# Patient Record
Sex: Female | Born: 1960 | Race: White | Hispanic: No | Marital: Married | Smoking: Never smoker
Health system: Southern US, Community
[De-identification: ages and names within clinical notes are randomized; demographics above are authoritative.]

## PROBLEM LIST (undated history)

## (undated) DIAGNOSIS — I493 Ventricular premature depolarization: Secondary | ICD-10-CM

## (undated) HISTORY — DX: Ventricular premature depolarization: I49.3

## (undated) HISTORY — PX: OTHER SURGICAL HISTORY: SHX169

---

## 1998-10-19 ENCOUNTER — Other Ambulatory Visit: Admission: RE | Admit: 1998-10-19 | Discharge: 1998-10-19 | Payer: Self-pay | Admitting: *Deleted

## 1999-12-13 ENCOUNTER — Other Ambulatory Visit: Admission: RE | Admit: 1999-12-13 | Discharge: 1999-12-13 | Payer: Self-pay | Admitting: *Deleted

## 2001-01-25 ENCOUNTER — Other Ambulatory Visit: Admission: RE | Admit: 2001-01-25 | Discharge: 2001-01-25 | Payer: Self-pay | Admitting: *Deleted

## 2002-03-24 ENCOUNTER — Other Ambulatory Visit: Admission: RE | Admit: 2002-03-24 | Discharge: 2002-03-24 | Payer: Self-pay | Admitting: *Deleted

## 2003-05-22 ENCOUNTER — Other Ambulatory Visit: Admission: RE | Admit: 2003-05-22 | Discharge: 2003-05-22 | Payer: Self-pay | Admitting: *Deleted

## 2004-08-09 ENCOUNTER — Other Ambulatory Visit: Admission: RE | Admit: 2004-08-09 | Discharge: 2004-08-09 | Payer: Self-pay | Admitting: Obstetrics and Gynecology

## 2005-05-23 ENCOUNTER — Ambulatory Visit (HOSPITAL_BASED_OUTPATIENT_CLINIC_OR_DEPARTMENT_OTHER): Admission: RE | Admit: 2005-05-23 | Discharge: 2005-05-23 | Payer: Self-pay | Admitting: Urology

## 2005-05-23 ENCOUNTER — Ambulatory Visit (HOSPITAL_COMMUNITY): Admission: RE | Admit: 2005-05-23 | Discharge: 2005-05-23 | Payer: Self-pay | Admitting: Urology

## 2005-10-08 ENCOUNTER — Encounter: Admission: RE | Admit: 2005-10-08 | Discharge: 2005-10-08 | Payer: Self-pay | Admitting: Obstetrics and Gynecology

## 2005-11-18 ENCOUNTER — Other Ambulatory Visit: Admission: RE | Admit: 2005-11-18 | Discharge: 2005-11-18 | Payer: Self-pay | Admitting: Obstetrics and Gynecology

## 2008-07-12 ENCOUNTER — Encounter: Admission: RE | Admit: 2008-07-12 | Discharge: 2008-07-12 | Payer: Self-pay | Admitting: Obstetrics and Gynecology

## 2009-05-22 ENCOUNTER — Encounter: Admission: RE | Admit: 2009-05-22 | Discharge: 2009-05-22 | Payer: Self-pay | Admitting: Obstetrics and Gynecology

## 2009-12-19 ENCOUNTER — Encounter: Admission: RE | Admit: 2009-12-19 | Discharge: 2009-12-19 | Payer: Self-pay | Admitting: Obstetrics and Gynecology

## 2010-07-10 ENCOUNTER — Encounter: Admission: RE | Admit: 2010-07-10 | Discharge: 2010-07-10 | Payer: Self-pay | Admitting: Obstetrics and Gynecology

## 2011-02-21 NOTE — Op Note (Signed)
NAMEJENEL, GIERKE                ACCOUNT NO.:  [id number]   MEDICAL RECORD NO.:  [id number]          PATIENT TYPE:  AMB   LOCATION:  [facility]                         FACILITY:  [facility]   PHYSICIAN:  Sigmund I. Patsi Sears, M.D.DATE OF BIRTH:  Feb 15, 1961   DATE OF PROCEDURE:  05/23/2005  DATE OF DISCHARGE:                                 OPERATIVE REPORT   PREOPERATIVE DIAGNOSES:  Urinary incontinence.   POSTOPERATIVE DIAGNOSES:  Urinary incontinence.   OPERATION:  [employer]  vaginal Obtryx sling.   SUBJECTIVE:  Sigmund I. Patsi Sears, M.D.   ASSISTANTMarland Kitchen  Kyung Bacca, NP-C.   PREPARATION:  After appropriate preanesthesia, the patient was brought to  the operating room, placed on the operating table in the dorsal supine  position where general LMA anesthesia was introduced. She was then replaced  in dorsal lithotomy position where the pubis was prepped with Betadine  solution, draped in the usual fashion.   DESCRIPTION OF PROCEDURE:  The Foley catheter was placed, posterior weighted  speculum was placed. 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 She had definite urethrocele.  10 mL of 0.25 plain Marcaine was injected in the periurethral space, and a  1.5 cm incision is made over the mid urethra. The subcutaneous tissue was  dissected, and the retropubic space is identified. The obturator fossa is  identified bilaterally. At a point 5 cm lateral to the clitoris, marks are  made, and stab wounds are made. The Obtryx  sling is then placed in standard  fashion and cystoscopy reveals no evidence of sling material within the  bladder. 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   The sling was tensioned in standard fashion, and the plastic sheathing  removed. Each wing was then cut subcutaneously, and these incisions were  closed with Dermabond. Following this, the wound itself was closed with a  running  2-0 Vicryl suture. Minimal bleeding was noted. The patient tolerated  the procedure well. A B&O suppository was inserted. She was given 30 mg of  IV Toradol, awakened and taken to the recovery room in good condition.      Sigmund I. Patsi Sears, M.D.  Electronically Signed     SIT/MEDQ  D:  05/23/2005  T:  05/23/2005  Job:  [id number]

## 2011-10-08 ENCOUNTER — Other Ambulatory Visit: Payer: Self-pay | Admitting: Obstetrics and Gynecology

## 2011-10-08 DIAGNOSIS — Z1231 Encounter for screening mammogram for malignant neoplasm of breast: Secondary | ICD-10-CM

## 2011-11-04 ENCOUNTER — Ambulatory Visit
Admission: RE | Admit: 2011-11-04 | Discharge: 2011-11-04 | Disposition: A | Discharge status: Home / Self Care | Payer: BC Managed Care – PPO | Source: Ambulatory Visit | Attending: Obstetrics and Gynecology | Admitting: Obstetrics and Gynecology

## 2011-11-04 DIAGNOSIS — Z1231 Encounter for screening mammogram for malignant neoplasm of breast: Secondary | ICD-10-CM

## 2011-11-06 ENCOUNTER — Other Ambulatory Visit: Payer: Self-pay | Admitting: Obstetrics and Gynecology

## 2011-11-06 DIAGNOSIS — R928 Other abnormal and inconclusive findings on diagnostic imaging of breast: Secondary | ICD-10-CM

## 2011-11-18 ENCOUNTER — Other Ambulatory Visit: Payer: BC Managed Care – PPO

## 2011-11-24 ENCOUNTER — Ambulatory Visit
Admission: RE | Admit: 2011-11-24 | Discharge: 2011-11-24 | Disposition: A | Discharge status: Home / Self Care | Payer: BC Managed Care – PPO | Source: Ambulatory Visit | Attending: Obstetrics and Gynecology | Admitting: Obstetrics and Gynecology

## 2011-11-24 DIAGNOSIS — R928 Other abnormal and inconclusive findings on diagnostic imaging of breast: Secondary | ICD-10-CM

## 2012-12-16 ENCOUNTER — Other Ambulatory Visit: Payer: Self-pay

## 2013-01-26 ENCOUNTER — Ambulatory Visit
Admission: RE | Admit: 2013-01-26 | Discharge: 2013-01-26 | Disposition: A | Discharge status: Home / Self Care | Payer: BC Managed Care – PPO | Source: Ambulatory Visit

## 2013-01-26 DIAGNOSIS — Z1231 Encounter for screening mammogram for malignant neoplasm of breast: Secondary | ICD-10-CM

## 2014-08-29 ENCOUNTER — Encounter (HOSPITAL_COMMUNITY): Payer: Self-pay

## 2014-09-06 ENCOUNTER — Ambulatory Visit (HOSPITAL_COMMUNITY): Payer: BC Managed Care – PPO | Admitting: Anesthesiology

## 2014-09-06 ENCOUNTER — Ambulatory Visit (HOSPITAL_COMMUNITY)
Admission: RE | Admit: 2014-09-06 | Discharge: 2014-09-06 | Disposition: A | Discharge status: Home / Self Care | Payer: BC Managed Care – PPO | Source: Ambulatory Visit | Attending: Obstetrics and Gynecology | Admitting: Obstetrics and Gynecology

## 2014-09-06 ENCOUNTER — Encounter (HOSPITAL_COMMUNITY)
Admission: RE | Disposition: A | Discharge status: Home / Self Care | Payer: Self-pay | Source: Ambulatory Visit | Attending: Obstetrics and Gynecology

## 2014-09-06 DIAGNOSIS — Z6839 Body mass index (BMI) 39.0-39.9, adult: Secondary | ICD-10-CM | POA: Diagnosis not present

## 2014-09-06 DIAGNOSIS — N840 Polyp of corpus uteri: Secondary | ICD-10-CM | POA: Diagnosis present

## 2014-09-06 DIAGNOSIS — N950 Postmenopausal bleeding: Secondary | ICD-10-CM | POA: Diagnosis present

## 2014-09-06 HISTORY — PX: HYSTEROSCOPY WITH D & C: SHX1775

## 2014-09-06 LAB — CBC
HCT: 36.9 % (ref 36.0–46.0)
HEMOGLOBIN: 12.3 g/dL (ref 12.0–15.0)
MCH: 29.3 pg (ref 26.0–34.0)
MCHC: 33.3 g/dL (ref 30.0–36.0)
MCV: 87.9 fL (ref 78.0–100.0)
PLATELETS: 288 10*3/uL (ref 150–400)
RBC: 4.2 MIL/uL (ref 3.87–5.11)
RDW: 13.9 % (ref 11.5–15.5)
WBC: 7.1 10*3/uL (ref 4.0–10.5)

## 2014-09-06 SURGERY — DILATATION AND CURETTAGE /HYSTEROSCOPY
Anesthesia: General | Site: Uterus

## 2014-09-06 MED ORDER — LIDOCAINE HCL 1 % IJ SOLN
INTRAMUSCULAR | Status: AC
  Filled 2014-09-06: qty 20

## 2014-09-06 MED ORDER — LIDOCAINE HCL (CARDIAC) 20 MG/ML IV SOLN
INTRAVENOUS | Status: AC
  Filled 2014-09-06: qty 5

## 2014-09-06 MED ORDER — KETOROLAC TROMETHAMINE 30 MG/ML IJ SOLN
INTRAMUSCULAR | Status: AC
  Filled 2014-09-06: qty 1

## 2014-09-06 MED ORDER — ONDANSETRON HCL 4 MG/2ML IJ SOLN
INTRAMUSCULAR | Status: AC
  Filled 2014-09-06: qty 2

## 2014-09-06 MED ORDER — LACTATED RINGERS IV SOLN
INTRAVENOUS | Status: DC
  Administered 2014-09-06: 11:00:00 via INTRAVENOUS

## 2014-09-06 MED ORDER — CEFAZOLIN SODIUM-DEXTROSE 2-3 GM-% IV SOLR
2.0000 g | INTRAVENOUS | Status: AC
  Administered 2014-09-06: 2 g via INTRAVENOUS

## 2014-09-06 MED ORDER — FENTANYL CITRATE 0.05 MG/ML IJ SOLN
INTRAMUSCULAR | Status: AC
  Filled 2014-09-06: qty 5

## 2014-09-06 MED ORDER — DEXAMETHASONE SODIUM PHOSPHATE 4 MG/ML IJ SOLN
INTRAMUSCULAR | Status: AC
  Filled 2014-09-06: qty 1

## 2014-09-06 MED ORDER — SCOPOLAMINE 1 MG/3DAYS TD PT72
MEDICATED_PATCH | TRANSDERMAL | Status: AC
  Administered 2014-09-06: 1.5 mg via TRANSDERMAL
  Filled 2014-09-06: qty 1

## 2014-09-06 MED ORDER — MIDAZOLAM HCL 2 MG/2ML IJ SOLN
INTRAMUSCULAR | Status: AC
  Filled 2014-09-06: qty 2

## 2014-09-06 MED ORDER — SCOPOLAMINE 1 MG/3DAYS TD PT72
1.0000 | MEDICATED_PATCH | Freq: Once | TRANSDERMAL | Status: DC
  Administered 2014-09-06: 1.5 mg via TRANSDERMAL

## 2014-09-06 MED ORDER — CEFAZOLIN SODIUM-DEXTROSE 2-3 GM-% IV SOLR
INTRAVENOUS | Status: AC
  Filled 2014-09-06: qty 50

## 2014-09-06 MED ORDER — FENTANYL CITRATE 0.05 MG/ML IJ SOLN: 25.0000 ug | INTRAMUSCULAR | Status: DC | PRN

## 2014-09-06 MED ORDER — PROPOFOL 10 MG/ML IV EMUL
INTRAVENOUS | Status: AC
  Filled 2014-09-06: qty 20

## 2014-09-06 MED ORDER — PROPOFOL 10 MG/ML IV BOLUS
INTRAVENOUS | Status: DC | PRN
  Administered 2014-09-06: 50 mg via INTRAVENOUS
  Administered 2014-09-06: 250 mg via INTRAVENOUS

## 2014-09-06 MED ORDER — LIDOCAINE HCL (CARDIAC) 20 MG/ML IV SOLN
INTRAVENOUS | Status: DC | PRN
  Administered 2014-09-06: 75 mg via INTRAVENOUS

## 2014-09-06 MED ORDER — ONDANSETRON HCL 4 MG/2ML IJ SOLN
INTRAMUSCULAR | Status: DC | PRN
  Administered 2014-09-06: 4 mg via INTRAVENOUS

## 2014-09-06 MED ORDER — SODIUM CHLORIDE 0.9 % IR SOLN
Status: DC | PRN
  Administered 2014-09-06: 3000 mL

## 2014-09-06 MED ORDER — FENTANYL CITRATE 0.05 MG/ML IJ SOLN
INTRAMUSCULAR | Status: DC | PRN
  Administered 2014-09-06 (×3): 50 ug via INTRAVENOUS

## 2014-09-06 MED ORDER — KETOROLAC TROMETHAMINE 30 MG/ML IJ SOLN
INTRAMUSCULAR | Status: DC | PRN
  Administered 2014-09-06: 30 mg via INTRAVENOUS

## 2014-09-06 MED ORDER — DEXAMETHASONE SODIUM PHOSPHATE 4 MG/ML IJ SOLN
INTRAMUSCULAR | Status: DC | PRN
  Administered 2014-09-06: 4 mg via INTRAVENOUS

## 2014-09-06 MED ORDER — LACTATED RINGERS IV SOLN: INTRAVENOUS | Status: DC

## 2014-09-06 MED ORDER — MIDAZOLAM HCL 2 MG/2ML IJ SOLN
INTRAMUSCULAR | Status: DC | PRN
  Administered 2014-09-06: 2 mg via INTRAVENOUS

## 2014-09-06 SURGICAL SUPPLY — 18 items
CANISTER SUCT 3000ML (MISCELLANEOUS) ×3 IMPLANT
CATH ROBINSON RED A/P 16FR (CATHETERS) ×3 IMPLANT
CLOTH BEACON ORANGE TIMEOUT ST (SAFETY) ×3 IMPLANT
CONTAINER PREFILL 10% NBF 60ML (FORM) ×6 IMPLANT
ELECT REM PT RETURN 9FT ADLT (ELECTROSURGICAL)
ELECTRODE REM PT RTRN 9FT ADLT (ELECTROSURGICAL) IMPLANT
GLOVE BIO SURGEON STRL SZ 6.5 (GLOVE) ×2 IMPLANT
GLOVE BIO SURGEONS STRL SZ 6.5 (GLOVE) ×1
GLOVE BIOGEL PI IND STRL 7.0 (GLOVE) IMPLANT
GLOVE BIOGEL PI INDICATOR 7.0 (GLOVE) ×4
GOWN STRL REUS W/TWL LRG LVL3 (GOWN DISPOSABLE) ×6 IMPLANT
LOOP ANGLED CUTTING 22FR (CUTTING LOOP) IMPLANT
PACK VAGINAL MINOR WOMEN LF (CUSTOM PROCEDURE TRAY) ×3 IMPLANT
PAD OB MATERNITY 4.3X12.25 (PERSONAL CARE ITEMS) ×3 IMPLANT
TOWEL OR 17X24 6PK STRL BLUE (TOWEL DISPOSABLE) ×6 IMPLANT
TUBING AQUILEX INFLOW (TUBING) ×3 IMPLANT
TUBING AQUILEX OUTFLOW (TUBING) ×3 IMPLANT
WATER STERILE IRR 1000ML POUR (IV SOLUTION) ×3 IMPLANT

## 2014-09-06 NOTE — Op Note (Signed)
NAMMilbert Coulter:  Dauphine, Gayla                ACCOUNT NO.:  [id number]  MEDICAL RECORD NO.:  [id number]  LOCATION:  [facility]                          FACILITY:  [facility]  PHYSICIAN:  Takelia Urieta L. Bienvenido Proehl, M.D.DATE OF BIRTH:  06-Jul-1961  DATE OF PROCEDURE:  09/06/2014 DATE OF DISCHARGE:  09/06/2014                              OPERATIVE REPORT   PREOPERATIVE DIAGNOSIS:  Postmenopausal bleeding and endometrial polyps.  POSTOPERATIVE DIAGNOSIS:  Postmenopausal bleeding and endometrial polyps.  PROCEDURE:  Dilation and curettage, hysteroscopy, and resection of endometrial polyps.  SURGEON:  Jaedon Siler L. Kailyn Dubie, MD  ANESTHESIA:  MAC paracervical.  EBL:  Minimal.  COMPLICATIONS:  None.  DESCRIPTION OF PROCEDURE:  The patient was taken to the operating room. She was then administered anesthesia.  She was prepped and draped in usual sterile fashion.  The cervix was grasped with a tenaculum.  The cervical internal os was gently dilated using Pratt dilators.  The diagnostic hysteroscope was inserted into the uterine cavity with excellent visualization.  I could see she had multiple polypoid areas noted in the uterus.  The hysteroscope was removed.  A sharp curette was inserted, and the uterus was thoroughly curetted of all tissue, and polyp forceps were inserted and polypoid tissue was removed.  The hysteroscope was reinserted, and the uterine cavity appeared to be clean.  A sharp curettage was then obtained.  All tissue was sent to pathology.  She had no bleeding at the end of the procedure.  The fluid deficit was 115 mL. The patient tolerated the procedure well and went to recovery room in stable condition.     Xareni Kelch L. Vincente PoliGrewal, M.D.     Florestine AversMLG/MEDQ  D:  09/06/2014  T:  09/06/2014  Job:  [id number]

## 2014-09-06 NOTE — H&P (Signed)
Wonda OldsLisa M Batt is an 53 year old female with endometrial polyps and heavy bleeding  Pertinent Gynecological History: Menses: post-menopausal Bleeding: post menopausal bleeding Contraception: post menopausal status DES exposure: denies Blood transfusions: none Sexually transmitted diseases: no past history Previous GYN Procedures: none  Last mammogram: normal Date: 2015 Last pap: normal Date: 2015 OB History: G1, P1  Menstrual History: Menarche age: unknown Patient's last menstrual period was 08/26/2014.    History reviewed. No pertinent past medical history.  Past Surgical History  Procedure Laterality Date  . Cesarean section    . Transvaginal mesh      History reviewed. No pertinent family history.  Social History:  reports that she has never smoked. She has never used smokeless tobacco. She reports that she does not drink alcohol or use illicit drugs.  Allergies: No Known Allergies  No prescriptions prior to admission    Review of Systems  All other systems reviewed and are negative.   Height 5\' 7"  (1.702 m), weight 113.853 kg (251 lb), last menstrual period 08/26/2014. Physical Exam  Nursing note and vitals reviewed. Constitutional: She appears well-developed and well-nourished.  HENT:  Head: Normocephalic and atraumatic.  Eyes: Pupils are equal, round, and reactive to light.  Neck: Normal range of motion.  Cardiovascular: Normal rate and regular rhythm.   Respiratory: Effort normal.  GI: Soft.    No results found for this or any previous visit (from the past 24 hour(s)).  No results found.  Assessment/Plan: Postmenopausal bleeding Endometrial polyps Recommend D and C, Hysteroscopy and removal of endometrial polyps Risks reviewed  Consent signed Obie Kallenbach L 09/06/2014, 8:59 AM

## 2014-09-06 NOTE — Anesthesia Postprocedure Evaluation (Signed)
  Anesthesia Post-op Note  Patient: Sabrina Brown  Procedure(s) Performed: Procedure(s): DILATATION AND CURETTAGE /HYSTEROSCOPY (N/A) Patient is awake and responsive. Pain and nausea are reasonably well controlled. Vital signs are stable and clinically acceptable. Oxygen saturation is clinically acceptable. There are no apparent anesthetic complications at this time. Patient is ready for discharge.

## 2014-09-06 NOTE — Transfer of Care (Signed)
Immediate Anesthesia Transfer of Care Note  Patient: Sabrina OldsLisa M Brown  Procedure(s) Performed: Procedure(s): DILATATION AND CURETTAGE /HYSTEROSCOPY (N/A)  Patient Location: PACU  Anesthesia Type:General  Level of Consciousness: awake, alert  and oriented  Airway & Oxygen Therapy: Patient Spontanous Breathing and Patient connected to nasal cannula oxygen  Post-op Assessment: Report given to PACU RN and Post -op Vital signs reviewed and stable  Post vital signs: Reviewed and stable  Complications: No apparent anesthesia complications

## 2014-09-06 NOTE — Brief Op Note (Signed)
09/06/2014  12:41 PM  PATIENT:  Sabrina Brown  53 y.o. female  PRE-OPERATIVE DIAGNOSIS:  POLYPS  POST-OPERATIVE DIAGNOSIS:  POLYPS  PROCEDURE:  Procedure(s): DILATATION AND CURETTAGE /HYSTEROSCOPY (N/A)  SURGEON:  Surgeon(s) and Role:    * Jeani HawkingMichelle L Makeisha Jentsch, MD - Primary  PHYSICIAN ASSISTANT:   ASSISTANTS: none   ANESTHESIA:   IV sedation, paracervical block and MAC  EBL:     BLOOD ADMINISTERED:none  DRAINS: none   LOCAL MEDICATIONS USED:  NONE  SPECIMEN:  Source of Specimen:  uterine currettings  DISPOSITION OF SPECIMEN:  PATHOLOGY  COUNTS:  YES  TOURNIQUET:  * No tourniquets in log *  DICTATION: .Note written in EPIC and Other Dictation: Dictation Number [id number]  PLAN OF CARE: Discharge to home after PACU  PATIENT DISPOSITION:  PACU - hemodynamically stable.   Delay start of Pharmacological VTE agent (>24hrs) due to surgical blood loss or risk of bleeding: not applicable

## 2014-09-06 NOTE — Anesthesia Preprocedure Evaluation (Signed)
Anesthesia Evaluation  Patient identified by MRN, date of birth, ID band Patient awake    Reviewed: Allergy & Precautions, H&P , Patient's Chart, lab work & pertinent test results, reviewed documented beta blocker date and time   Airway Mallampati: II  TM Distance: >3 FB Neck ROM: full    Dental no notable dental hx.    Pulmonary  breath sounds clear to auscultation  Pulmonary exam normal       Cardiovascular Rhythm:regular Rate:Normal     Neuro/Psych    GI/Hepatic   Endo/Other  Morbid obesity  Renal/GU      Musculoskeletal   Abdominal   Peds  Hematology   Anesthesia Other Findings   Reproductive/Obstetrics                             Anesthesia Physical Anesthesia Plan  ASA: III  Anesthesia Plan:    Post-op Pain Management:    Induction: Intravenous  Airway Management Planned: LMA  Additional Equipment:   Intra-op Plan:   Post-operative Plan:   Informed Consent: I have reviewed the patients History and Physical, chart, labs and discussed the procedure including the risks, benefits and alternatives for the proposed anesthesia with the patient or authorized representative who has indicated his/her understanding and acceptance.   Dental Advisory Given and Dental advisory given  Plan Discussed with: CRNA and Surgeon  Anesthesia Plan Comments: (Discussed GA with LMA, possible sore throat, potential need to switch to ETT, N/V, pulmonary aspiration. Questions answered. )        Anesthesia Quick Evaluation  

## 2014-09-06 NOTE — Discharge Instructions (Signed)
DISCHARGE INSTRUCTIONS: HYSTEROSCOPY / ENDOMETRIAL ABLATION The following instructions have been prepared to help you care for yourself upon your return home.  Scop patch may be remove on or before 09/08/2014.  May take Ibuprofen after 6:45 p.m. as needed for cramps  Personal hygiene:  Use sanitary pads for vaginal drainage, not tampons.  Shower the day after your procedure.  NO tub baths, pools or Jacuzzis for 2-3 weeks.  Wipe front to back after using the bathroom.  Activity and limitations:  Do NOT drive or operate any equipment for 24 hours. The effects of anesthesia are still present and drowsiness may result.  Do NOT rest in bed all day.  Walking is encouraged.  Walk up and down stairs slowly.  You may resume your normal activity in one to two days or as indicated by your physician.  Sexual activity: NO intercourse for at least 2 weeks after the procedure, or as indicated by your Doctor.  Diet: Eat a light meal as desired this evening. You may resume your usual diet tomorrow.  Return to Work: You may resume your work activities in one to two days or as indicated by Therapist, sportsyour Doctor.  What to expect after your surgery: Expect to have vaginal bleeding/discharge for 2-3 days and spotting for up to 10 days. It is not unusual to have soreness for up to 1-2 weeks. You may have a slight burning sensation when you urinate for the first day. Mild cramps may continue for a couple of days. You may have a regular period in 2-6 weeks.  Call your doctor for any of the following:  Excessive vaginal bleeding or clotting, saturating and changing one pad every hour.  Inability to urinate 6 hours after discharge from hospital.  Pain not relieved by pain medication.  Fever of 100.4 F or greater.  Unusual vaginal discharge or odor.  Post Anesthesia Care Unit [phone]

## 2014-09-06 NOTE — Progress Notes (Signed)
Megace stopped bleeding Will proceed with D and C, Hysteroscopy and polyp removal Consent signed H and P on the chart

## 2014-09-07 ENCOUNTER — Encounter (HOSPITAL_COMMUNITY): Payer: Self-pay | Admitting: Obstetrics and Gynecology

## 2014-09-19 ENCOUNTER — Ambulatory Visit (HOSPITAL_BASED_OUTPATIENT_CLINIC_OR_DEPARTMENT_OTHER)
Admission: RE | Admit: 2014-09-19 | Payer: BC Managed Care – PPO | Source: Ambulatory Visit | Admitting: Obstetrics and Gynecology

## 2014-09-19 ENCOUNTER — Encounter (HOSPITAL_BASED_OUTPATIENT_CLINIC_OR_DEPARTMENT_OTHER): Admission: RE | Payer: Self-pay | Source: Ambulatory Visit

## 2014-09-19 SURGERY — DILATATION AND CURETTAGE /HYSTEROSCOPY
Anesthesia: Choice

## 2015-05-30 ENCOUNTER — Other Ambulatory Visit: Payer: Self-pay | Admitting: Obstetrics and Gynecology

## 2015-05-31 LAB — CYTOLOGY - PAP

## 2016-07-23 ENCOUNTER — Other Ambulatory Visit: Payer: Self-pay | Admitting: Obstetrics and Gynecology

## 2016-07-23 DIAGNOSIS — Z1231 Encounter for screening mammogram for malignant neoplasm of breast: Secondary | ICD-10-CM

## 2016-08-12 ENCOUNTER — Ambulatory Visit
Admission: RE | Admit: 2016-08-12 | Discharge: 2016-08-12 | Disposition: A | Discharge status: Home / Self Care | Payer: BC Managed Care – PPO | Source: Ambulatory Visit | Attending: Obstetrics and Gynecology | Admitting: Obstetrics and Gynecology

## 2016-08-12 DIAGNOSIS — Z1231 Encounter for screening mammogram for malignant neoplasm of breast: Secondary | ICD-10-CM

## 2020-04-11 ENCOUNTER — Other Ambulatory Visit: Payer: Self-pay | Admitting: Obstetrics and Gynecology

## 2020-04-11 DIAGNOSIS — Z1231 Encounter for screening mammogram for malignant neoplasm of breast: Secondary | ICD-10-CM

## 2020-06-18 ENCOUNTER — Other Ambulatory Visit: Payer: Self-pay

## 2020-06-18 ENCOUNTER — Ambulatory Visit
Admission: RE | Admit: 2020-06-18 | Discharge: 2020-06-18 | Disposition: A | Discharge status: Home / Self Care | Payer: BC Managed Care – PPO | Source: Ambulatory Visit | Attending: Obstetrics and Gynecology | Admitting: Obstetrics and Gynecology

## 2020-06-18 DIAGNOSIS — Z1231 Encounter for screening mammogram for malignant neoplasm of breast: Secondary | ICD-10-CM

## 2021-12-21 IMAGING — MG DIGITAL SCREENING BILAT W/ TOMO W/ CAD
8 series · 8 of 24 positions shown · non-contrast
Comparison: Previous exam(s).

ACR Breast Density Category a: The breast tissue is almost entirely
fatty.

CLINICAL DATA: Screening.

EXAM:
DIGITAL SCREENING BILATERAL MAMMOGRAM WITH TOMO AND CAD

[L MLO synth-2D]
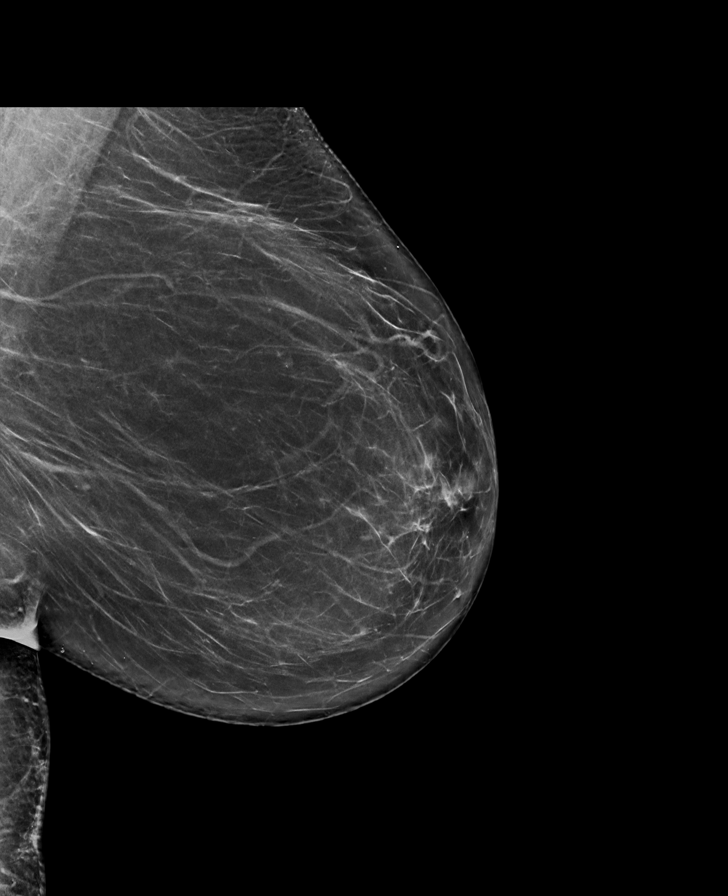

[R CC synth-2D]
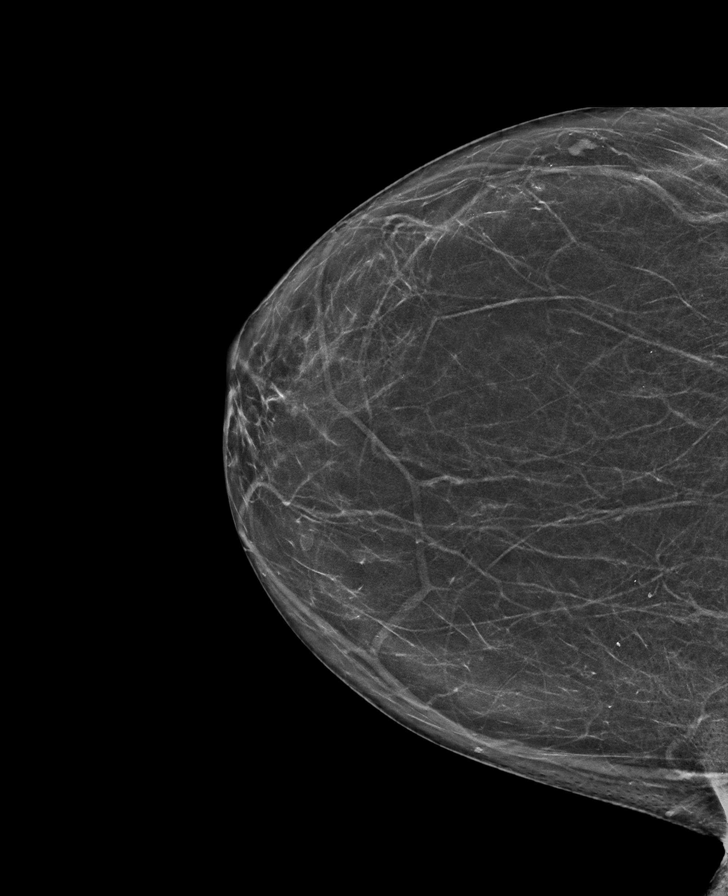

[R MLO synth-2D]
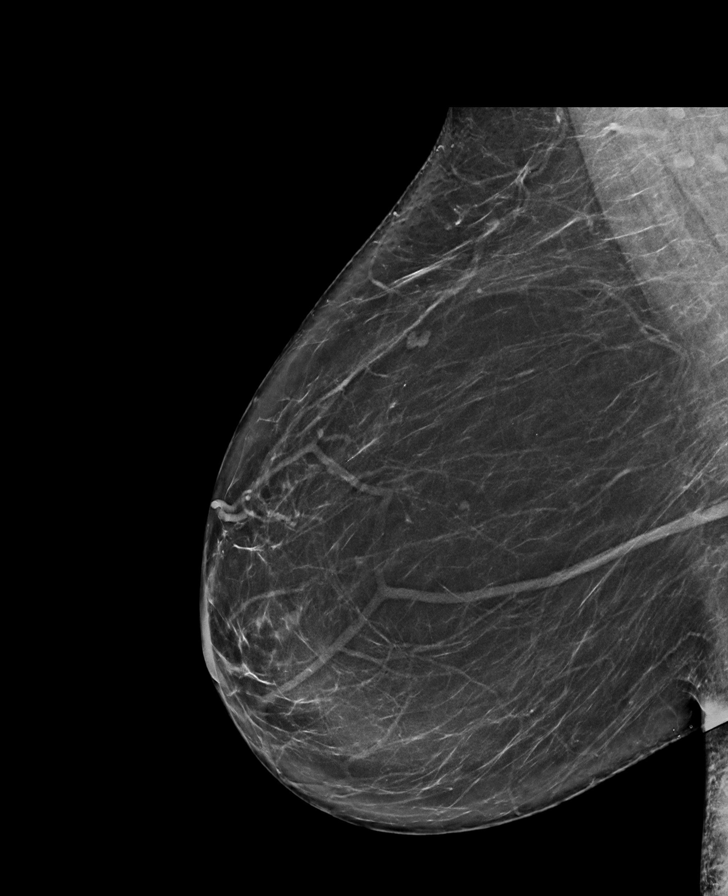

[L CC synth-2D]
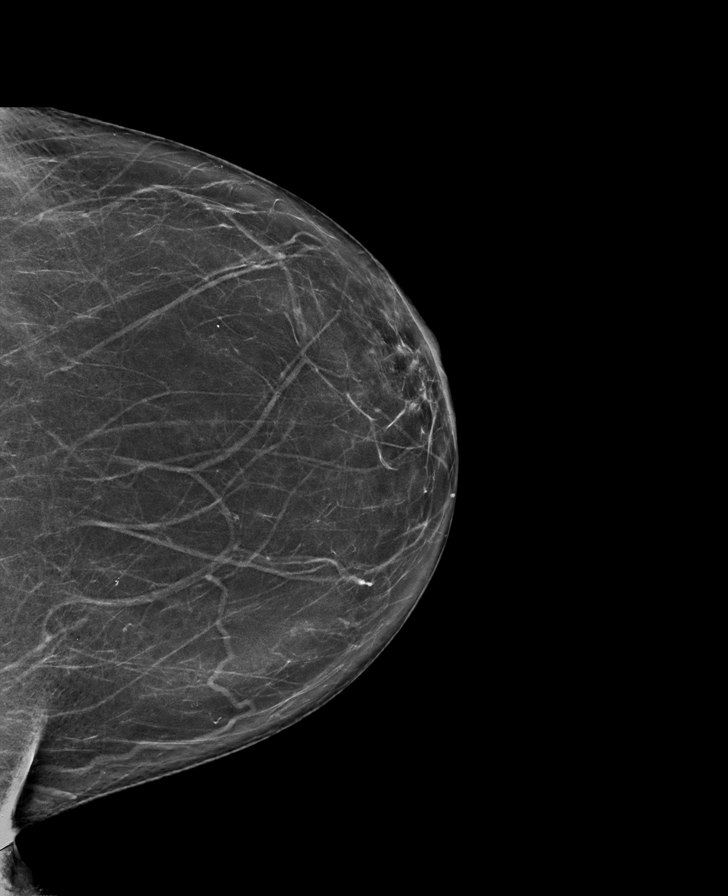

[L CC tomo · tomo slice 37/72.0]
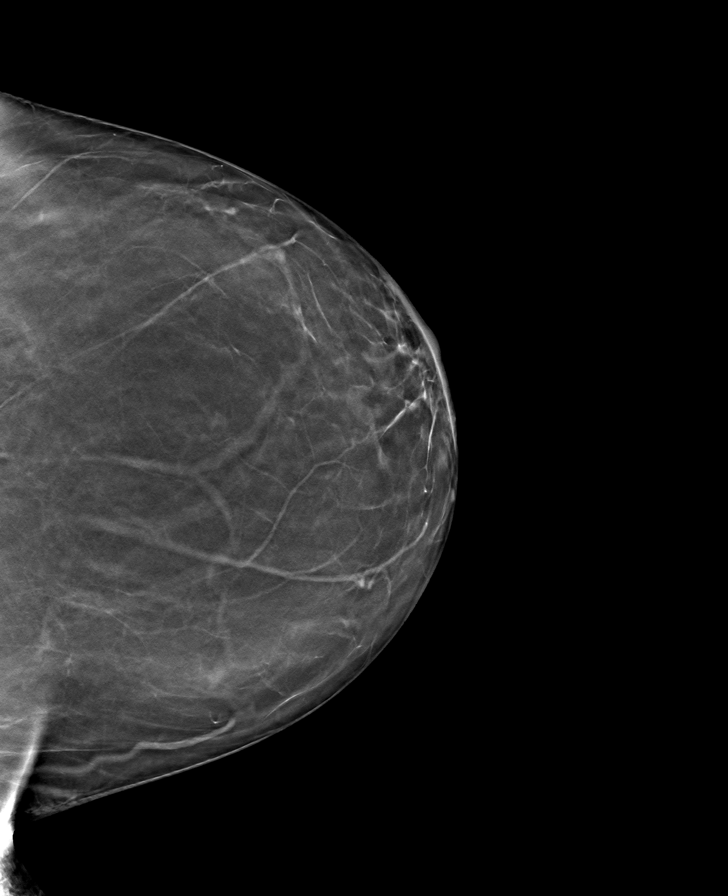

[R CC tomo · tomo slice 34/67.0]
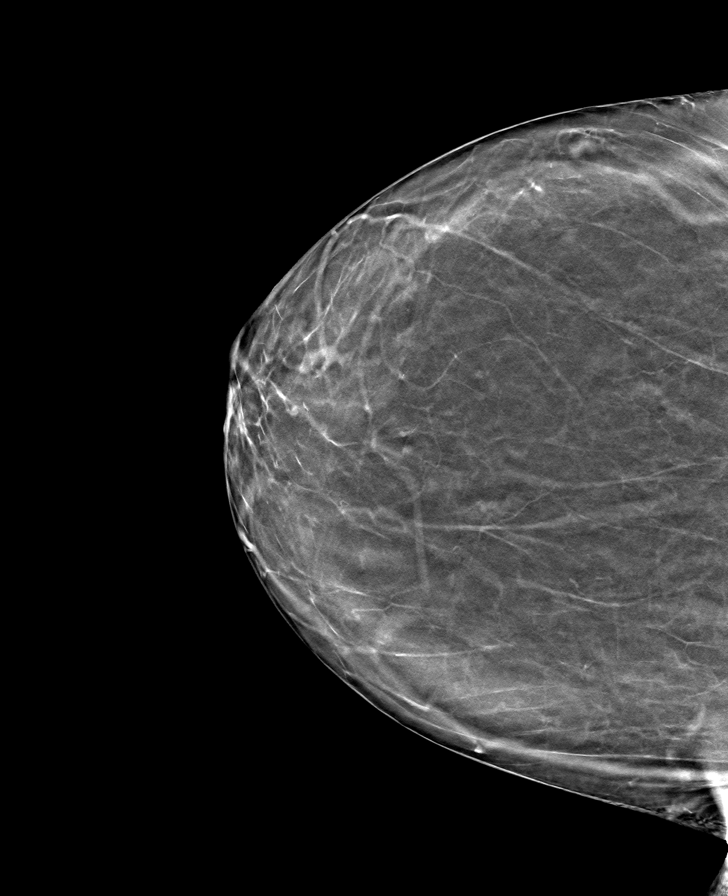

[L MLO tomo · tomo slice 41/81.0]
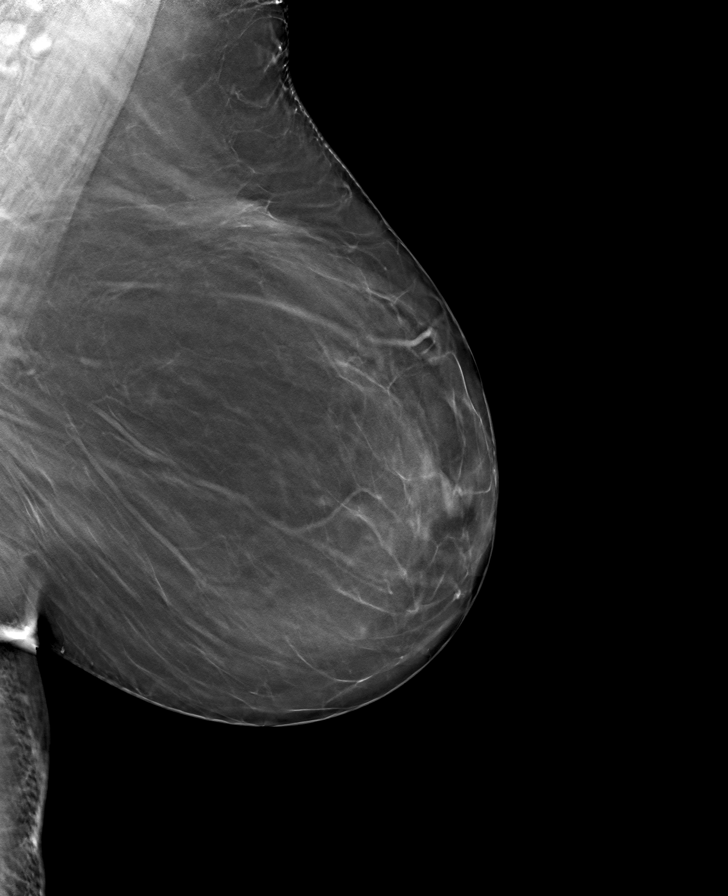

[R MLO tomo · tomo slice 40/79.0]
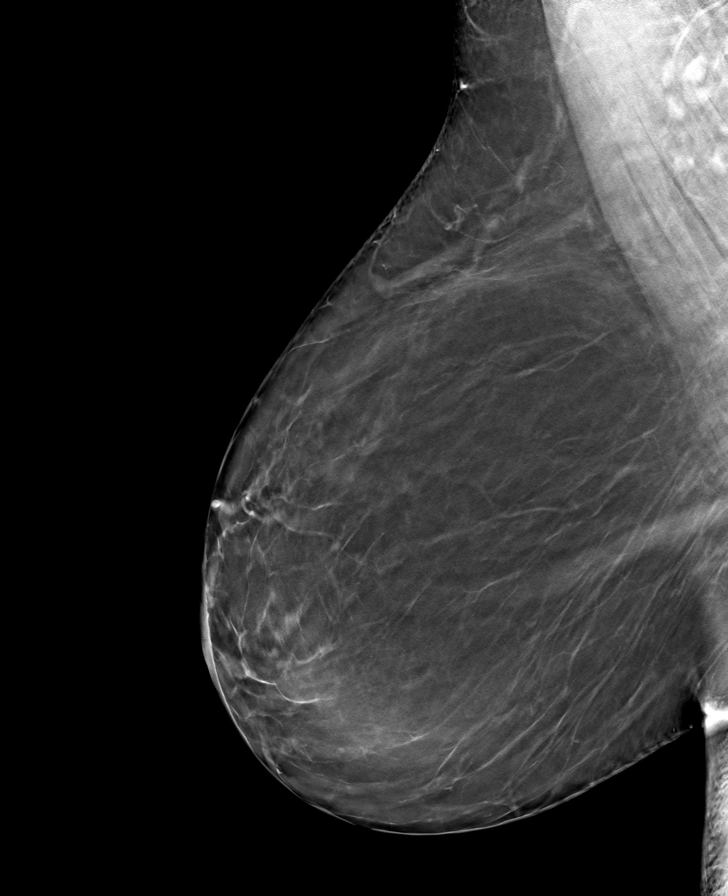

[8 of 24 positions shown; findings below may reference images not displayed]

FINDINGS: There are no findings suspicious for malignancy. Images were
processed with CAD.
IMPRESSION: No mammographic evidence of malignancy. A result letter of this
screening mammogram will be mailed directly to the patient.

RECOMMENDATION:
Screening mammogram in one year. (Code:8Y-Q-VVS)

BI-RADS CATEGORY  1: Negative.

## 2023-09-03 LAB — EXTERNAL GENERIC LAB PROCEDURE: COLOGUARD: POSITIVE — AB

## 2023-09-03 LAB — COLOGUARD: COLOGUARD: POSITIVE — AB

## 2023-09-23 ENCOUNTER — Encounter: Payer: Self-pay | Admitting: Internal Medicine

## 2023-10-27 ENCOUNTER — Telehealth: Payer: Self-pay | Admitting: *Deleted

## 2023-10-27 ENCOUNTER — Ambulatory Visit (AMBULATORY_SURGERY_CENTER): Payer: 59 | Admitting: *Deleted

## 2023-10-27 VITALS — Ht 66.0 in | Wt 263.0 lb

## 2023-10-27 DIAGNOSIS — Z1211 Encounter for screening for malignant neoplasm of colon: Secondary | ICD-10-CM

## 2023-10-27 MED ORDER — SUFLAVE 178.7 G PO SOLR: 1.0000 | ORAL | 0 refills | Status: AC

## 2023-10-27 NOTE — Progress Notes (Signed)
Pt's name and DOB verified at the beginning of the pre-visit wit 2 identifiers  Pt denies any difficulty with ambulating,sitting, laying down or rolling side to side  Pt has no issues with ambulation   Pt has no issues moving head neck or swallowing  No egg or soy allergy known to patient   No issues known to pt with past sedation with any surgeries or procedures  Pt denies having issues being intubated  No FH of Malignant Hyperthermia  Pt is not on diet pills or shots  Pt is not on home 02   Pt is not on blood thinners   Pt denies issues with constipation   Pt is not on dialysis  Pt hx of benign PVC's  Pt denies any upcoming cardiac testing  Pt encouraged to use to use Singlecare or Goodrx to reduce cost   Patient's chart reviewed by Cathlyn Parsons CNRA prior to pre-visit and patient appropriate for the [facility].  Pre-visit completed and red dot placed by patient's name on their procedure day (on provider's schedule).  .  Visit by phone  Pt states weight is 263 lb  Instructed pt why it is important to and  to call if they have any changes in health or new medications. Directed them to the # given and on instructions.     Instructions reviewed. Pt given both [facility] main # and MD on call # prior to instructions.  Pt states understanding. Instructed to review again prior to procedure. Pt states they will.   Informed pt that they will receive a call from [facility] regarding there prep med.

## 2023-10-27 NOTE — Telephone Encounter (Signed)
Attempt to reach pt for pre-visit. LM with call back #.   Will attempt other number in profile   Reached pt on second attempt

## 2023-11-04 ENCOUNTER — Encounter: Payer: Self-pay | Admitting: Internal Medicine

## 2023-11-10 ENCOUNTER — Telehealth: Payer: Self-pay | Admitting: Internal Medicine

## 2023-11-10 NOTE — Telephone Encounter (Signed)
Inbound call from patient, states she has a dry cough and congestion but no fever. She would like to know if she can continue with procedure on Friday.

## 2023-11-10 NOTE — Telephone Encounter (Signed)
 Returned call to patient. She is scheduled for colonoscopy procedure on Thursday 11/12/23. She reports dry cough and some congestion. Ensured patient that we can proceed with the procedure as scheduled and  instructed her to follow the prep instructions. Patient agreed to call us  tomorrow if her symptoms worsen and she needs to reschedule.

## 2023-11-10 NOTE — Telephone Encounter (Signed)
Charge RN, Please review previous message and advise

## 2023-11-12 ENCOUNTER — Encounter: Payer: Self-pay | Admitting: Internal Medicine

## 2023-11-12 ENCOUNTER — Ambulatory Visit: Payer: 59 | Admitting: Internal Medicine

## 2023-11-12 VITALS — BP 149/85 | HR 77 | Temp 98.4°F | Resp 11 | Ht 66.0 in | Wt 263.0 lb

## 2023-11-12 DIAGNOSIS — D123 Benign neoplasm of transverse colon: Secondary | ICD-10-CM

## 2023-11-12 DIAGNOSIS — K635 Polyp of colon: Secondary | ICD-10-CM | POA: Diagnosis not present

## 2023-11-12 DIAGNOSIS — D124 Benign neoplasm of descending colon: Secondary | ICD-10-CM | POA: Diagnosis not present

## 2023-11-12 DIAGNOSIS — D122 Benign neoplasm of ascending colon: Secondary | ICD-10-CM

## 2023-11-12 DIAGNOSIS — Z1211 Encounter for screening for malignant neoplasm of colon: Secondary | ICD-10-CM

## 2023-11-12 DIAGNOSIS — D125 Benign neoplasm of sigmoid colon: Secondary | ICD-10-CM

## 2023-11-12 MED ORDER — SODIUM CHLORIDE 0.9 % IV SOLN: 500.0000 mL | Freq: Once | INTRAVENOUS | Status: DC

## 2023-11-12 NOTE — Progress Notes (Signed)
 HISTORY OF PRESENT ILLNESS:  Sabrina Brown is a 63 y.o. female sent for routine screening colonoscopy.  No complaints  REVIEW OF SYSTEMS:  All non-GI ROS negative except for  Past Medical History:  Diagnosis Date   PVC (premature ventricular contraction)    Benign    Past Surgical History:  Procedure Laterality Date   CESAREAN SECTION     HYSTEROSCOPY WITH D & C N/A 09/06/2014   Procedure: DILATATION AND CURETTAGE /HYSTEROSCOPY;  Surgeon: Rosaline LITTIE Cobble, MD;  Location: [facility];  Service: Gynecology;  Laterality: N/A;   transvaginal mesh      Social History Sabrina Brown  reports that she has never smoked. She has never used smokeless tobacco. She reports that she does not drink alcohol and does not use drugs.  family history is not on file.  No Known Allergies     PHYSICAL EXAMINATION: Vital signs: BP (!) 158/71   Pulse 86   Temp 98.4 F (36.9 C) (Temporal)   Ht 5' 6 (1.676 m)   Wt 263 lb (119.3 kg)   LMP 08/26/2014   SpO2 97%   BMI 42.45 kg/m  General: Well-developed, well-nourished, no acute distress HEENT: Sclerae are anicteric, conjunctiva pink. Oral mucosa intact Lungs: Clear Heart: Regular Abdomen: soft, nontender, nondistended, no obvious ascites, no peritoneal signs, normal bowel sounds. No organomegaly. Extremities: No edema Psychiatric: alert and oriented x3. Cooperative     ASSESSMENT:  Colon cancer screening   PLAN:  Screening colonoscopy

## 2023-11-12 NOTE — Progress Notes (Signed)
 Vss nad trans to pacu

## 2023-11-12 NOTE — Patient Instructions (Signed)
-   Repeat colonoscopy in 1 year for surveillance.  - Resume previous diet - Continue present medications. - Await pathology results  YOU HAD AN ENDOSCOPIC PROCEDURE TODAY AT THE [facility]:   Refer to the procedure report that was given to you for any specific questions about what was found during the examination.  If the procedure report does not answer your questions, please call your gastroenterologist to clarify.  If you requested that your care partner not be given the details of your procedure findings, then the procedure report has been included in a sealed envelope for you to review at your convenience later.  YOU SHOULD EXPECT: Some feelings of bloating in the abdomen. Passage of more gas than usual.  Walking can help get rid of the air that was put into your GI tract during the procedure and reduce the bloating. If you had a lower endoscopy (such as a colonoscopy or flexible sigmoidoscopy) you may notice spotting of blood in your stool or on the toilet paper. If you underwent a bowel prep for your procedure, you may not have a normal bowel movement for a few days.  Please Note:  You might notice some irritation and congestion in your nose or some drainage.  This is from the oxygen used during your procedure.  There is no need for concern and it should clear up in a day or so.  SYMPTOMS TO REPORT IMMEDIATELY:  Following lower endoscopy (colonoscopy or flexible sigmoidoscopy):  Excessive amounts of blood in the stool  Significant tenderness or worsening of abdominal pains  Swelling of the abdomen that is new, acute  Fever of 100F or higher  For urgent or emergent issues, a gastroenterologist can be reached at any hour by calling [phone]. Do not use MyChart messaging for urgent concerns.    DIET:  We do recommend a small meal at first, but then you may proceed to your regular diet.  Drink plenty of fluids but you should avoid alcoholic beverages for 24  hours.  ACTIVITY:  You should plan to take it easy for the rest of today and you should NOT DRIVE or use heavy machinery until tomorrow (because of the sedation medicines used during the test).    FOLLOW UP: Our staff will call the number listed on your records the next business day following your procedure.  We will call around 7:15- 8:00 am to check on you and address any questions or concerns that you may have regarding the information given to you following your procedure. If we do not reach you, we will leave a message.     If any biopsies were taken you will be contacted by phone or by letter within the next 1-3 weeks.  Please call us  at [phone] if you have not heard about the biopsies in 3 weeks.    SIGNATURES/CONFIDENTIALITY: You and/or your care partner have signed paperwork which will be entered into your electronic medical record.  These signatures attest to the fact that that the information above on your After Visit Summary has been reviewed and is understood.  Full responsibility of the confidentiality of this discharge information lies with you and/or your care-partner.

## 2023-11-12 NOTE — Op Note (Signed)
 [facility] Patient Name: Sabrina Brown Procedure Date: 11/12/2023 9:52 AM MRN: [id number] Endoscopist: Norleen SAILOR. Abran , MD, [id number] Age: 63 Referring MD:  Date of Birth: 20-Nov-1960 Gender: Female Account #: [id number] Procedure:                Colonoscopy with hot snare x 3; cold x 6; contr                            bleeding; sub-inject tattoo Indications:              Screening for colorectal malignant neoplasm;                            positive Cologuard testing Medicines:                Monitored Anesthesia Care Procedure:                Pre-Anesthesia Assessment:                           - Prior to the procedure, a History and Physical                            was performed, and patient medications and                            allergies were reviewed. The patient's tolerance of                            previous anesthesia was also reviewed. The risks                            and benefits of the procedure and the sedation                            options and risks were discussed with the patient.                            All questions were answered, and informed consent                            was obtained. Prior Anticoagulants: The patient has                            taken no anticoagulant or antiplatelet agents. ASA                            Grade Assessment: II - A patient with mild systemic                            disease. After reviewing the risks and benefits,                            the patient was deemed in satisfactory condition to  undergo the procedure.                           After obtaining informed consent, the colonoscope                            was passed under direct vision. Throughout the                            procedure, the patient's blood pressure, pulse, and                            oxygen saturations were monitored continuously. The                            Olympus Scope SN:  [id number] was introduced through                            the anus and advanced to the the cecum, identified                            by appendiceal orifice and ileocecal valve. The                            ileocecal valve, appendiceal orifice, and rectum                            were photographed. The quality of the bowel                            preparation was excellent. The colonoscopy was                            performed without difficulty. The patient tolerated                            the procedure well. The bowel preparation used was                            SUPREP via split dose instruction. Scope In: 9:54:46 AM Scope Out: 10:31:19 AM Scope Withdrawal Time: 0 hours 31 minutes 4 seconds  Total Procedure Duration: 0 hours 36 minutes 33 seconds  Findings:                 An 18 mm polyp was found in the ascending colon.                            The polyp was semi-pedunculated. The polyp was                            removed with a hot snare. Resection and retrieval                            were complete. The polypectomy site had a  nonbleeding vessel. This was cauterized. After                            cauterization, it bled. Bleeding was controlled                            with endoscopic clips x 3. See images.                           Six polyps were found in the sigmoid colon,                            descending colon and ascending colon. The polyps                            were 3 to 5 mm in size. These polyps were removed                            with a cold snare. Resection and retrieval were                            complete.                           A 15 mm polyp was found in the transverse colon.                            The polyp was semi-pedunculated. The polyp was                            removed with a hot snare. Resection and retrieval                            were complete.                           A  25 mm polyp was found in the sigmoid colon. The                            polyp was pedunculated. The polyp was removed with                            a hot snare. Resection and retrieval were complete.                           The exam was otherwise without abnormality on                            direct and retroflexion views. Complications:            No immediate complications. Estimated blood loss:                            None. Estimated Blood Loss:  Estimated blood loss: none. Impression:               - One 18 mm polyp in the ascending colon, removed                            with a hot snare. Resected and retrieved.                           - Six 3 to 5 mm polyps in the sigmoid colon, in the                            descending colon and in the ascending colon,                            removed with a cold snare. Resected and retrieved.                           - One 15 mm polyp in the transverse colon, removed                            with a hot snare. Resected and retrieved.                           - One 25 mm polyp in the sigmoid colon, removed                            with a hot snare. Resected and retrieved. The area                            was tattooed just distal (downstream) from the                            polypectomy site.                           - The examination was otherwise normal on direct                            and retroflexion views. Recommendation:           - Repeat colonoscopy in 1 year for surveillance.                           - Patient has a contact number available for                            emergencies. The signs and symptoms of potential                            delayed complications were discussed with the                            patient. Return to normal activities tomorrow.  Written discharge instructions were provided to the                            patient.                            - Resume previous diet.                           - Continue present medications.                           - Await pathology results. Norleen SAILOR. Abran, MD 11/12/2023 10:43:39 AM This report has been signed electronically.

## 2023-11-12 NOTE — Progress Notes (Signed)
 Pt's states no medical or surgical changes since previsit or office visit.

## 2023-11-13 ENCOUNTER — Telehealth: Payer: Self-pay

## 2023-11-13 NOTE — Telephone Encounter (Signed)
  Follow up Call-     11/12/2023    9:21 AM  Call back number  Post procedure Call Back phone  # [phone]  Permission to leave phone message Yes     Patient questions:  Do you have a fever, pain , or abdominal swelling? No. Pain Score  0 *  Have you tolerated food without any problems? Yes.    Have you been able to return to your normal activities? No.  Do you have any questions about your discharge instructions: Diet   Yes.   Medications  No. Follow up visit  No.  Do you have questions or concerns about your Care? No.  Actions: * If pain score is 4 or above: No action needed, pain <4.

## 2023-12-01 ENCOUNTER — Encounter: Payer: Self-pay | Admitting: Internal Medicine

## 2023-12-01 LAB — SURGICAL PATHOLOGY
